# Patient Record
Sex: Female | Born: 2000 | Race: Black or African American | Hispanic: No | Marital: Single | Smoking: Never smoker
Health system: Southern US, Community
[De-identification: ages and names within clinical notes are randomized; demographics above are authoritative.]

---

## 2021-04-21 DIAGNOSIS — N3001 Acute cystitis with hematuria: Secondary | ICD-10-CM | POA: Diagnosis not present

## 2021-04-21 DIAGNOSIS — R109 Unspecified abdominal pain: Secondary | ICD-10-CM | POA: Diagnosis not present

## 2021-04-21 DIAGNOSIS — R319 Hematuria, unspecified: Secondary | ICD-10-CM | POA: Diagnosis not present

## 2021-05-19 DIAGNOSIS — J988 Other specified respiratory disorders: Secondary | ICD-10-CM | POA: Diagnosis not present

## 2021-06-17 DIAGNOSIS — N390 Urinary tract infection, site not specified: Secondary | ICD-10-CM | POA: Diagnosis not present

## 2021-09-07 DIAGNOSIS — N3001 Acute cystitis with hematuria: Secondary | ICD-10-CM | POA: Diagnosis not present

## 2021-09-07 DIAGNOSIS — R350 Frequency of micturition: Secondary | ICD-10-CM | POA: Diagnosis not present

## 2021-09-07 DIAGNOSIS — R300 Dysuria: Secondary | ICD-10-CM | POA: Diagnosis not present

## 2021-09-07 DIAGNOSIS — M545 Low back pain, unspecified: Secondary | ICD-10-CM | POA: Diagnosis not present

## 2021-12-14 ENCOUNTER — Emergency Department (HOSPITAL_COMMUNITY): Payer: 59

## 2021-12-14 ENCOUNTER — Emergency Department (HOSPITAL_COMMUNITY)
Admission: EM | Admit: 2021-12-14 | Discharge: 2021-12-14 | Disposition: A | Discharge status: Home / Self Care | Payer: 59 | Attending: Emergency Medicine | Admitting: Emergency Medicine

## 2021-12-14 ENCOUNTER — Encounter (HOSPITAL_COMMUNITY): Payer: Self-pay

## 2021-12-14 ENCOUNTER — Other Ambulatory Visit: Payer: Self-pay

## 2021-12-14 DIAGNOSIS — O0001 Abdominal pregnancy with intrauterine pregnancy: Secondary | ICD-10-CM | POA: Insufficient documentation

## 2021-12-14 DIAGNOSIS — R350 Frequency of micturition: Secondary | ICD-10-CM | POA: Diagnosis not present

## 2021-12-14 DIAGNOSIS — R1030 Lower abdominal pain, unspecified: Secondary | ICD-10-CM

## 2021-12-14 DIAGNOSIS — Z3490 Encounter for supervision of normal pregnancy, unspecified, unspecified trimester: Secondary | ICD-10-CM

## 2021-12-14 DIAGNOSIS — R42 Dizziness and giddiness: Secondary | ICD-10-CM | POA: Diagnosis not present

## 2021-12-14 DIAGNOSIS — R102 Pelvic and perineal pain: Secondary | ICD-10-CM | POA: Diagnosis not present

## 2021-12-14 DIAGNOSIS — R110 Nausea: Secondary | ICD-10-CM | POA: Diagnosis not present

## 2021-12-14 DIAGNOSIS — Z3A01 Less than 8 weeks gestation of pregnancy: Secondary | ICD-10-CM | POA: Insufficient documentation

## 2021-12-14 DIAGNOSIS — O0991 Supervision of high risk pregnancy, unspecified, first trimester: Secondary | ICD-10-CM | POA: Diagnosis not present

## 2021-12-14 DIAGNOSIS — R55 Syncope and collapse: Secondary | ICD-10-CM

## 2021-12-14 LAB — WET PREP, GENITAL
Clue Cells Wet Prep HPF POC: NONE SEEN
Sperm: NONE SEEN
Trich, Wet Prep: NONE SEEN
WBC, Wet Prep HPF POC: <10 (ref <10)
Yeast Wet Prep HPF POC: NONE SEEN

## 2021-12-14 LAB — CBC
HCT: 38.1 % (ref 36.0–46.0)
Hemoglobin: 12.6 g/dL (ref 12.0–15.0)
MCH: 26.6 pg (ref 26.0–34.0)
MCHC: 33.1 g/dL (ref 30.0–36.0)
MCV: 80.5 fL (ref 80.0–100.0)
Platelets: 338 10*3/uL (ref 150–400)
RBC: 4.73 MIL/uL (ref 3.87–5.11)
RDW: 14.8 % (ref 11.5–15.5)
WBC: 5.5 10*3/uL (ref 4.0–10.5)
nRBC: 0 % (ref 0.0–0.2)

## 2021-12-14 LAB — D-DIMER, QUANTITATIVE: D-Dimer, Quant: <0.27 ug/mL-FEU (ref 0.00–0.50)

## 2021-12-14 LAB — URINALYSIS, ROUTINE W REFLEX MICROSCOPIC
Bilirubin Urine: NEGATIVE
Glucose, UA: NEGATIVE mg/dL
Hgb urine dipstick: NEGATIVE
Ketones, ur: NEGATIVE mg/dL
Leukocytes,Ua: NEGATIVE
Nitrite: NEGATIVE
Protein, ur: NEGATIVE mg/dL
Specific Gravity, Urine: 1.01 (ref 1.005–1.030)
pH: 6 (ref 5.0–8.0)

## 2021-12-14 LAB — BASIC METABOLIC PANEL
Anion gap: 7 (ref 5–15)
BUN: 9 mg/dL (ref 6–20)
CO2: 21 mmol/L — ABNORMAL LOW (ref 22–32)
Calcium: 9.1 mg/dL (ref 8.9–10.3)
Chloride: 104 mmol/L (ref 98–111)
Creatinine, Ser: 0.54 mg/dL (ref 0.44–1.00)
GFR, Estimated: >60 mL/min (ref >60)
Glucose, Bld: 85 mg/dL (ref 70–99)
Potassium: 3.6 mmol/L (ref 3.5–5.1)
Sodium: 132 mmol/L — ABNORMAL LOW (ref 135–145)

## 2021-12-14 LAB — HCG, QUANTITATIVE, PREGNANCY: hCG, Beta Chain, Quant, S: 71136 m[IU]/mL — ABNORMAL HIGH (ref <5)

## 2021-12-14 MED ORDER — SODIUM CHLORIDE 0.9 % IV BOLUS
1000.0000 mL | Freq: Once | INTRAVENOUS | Status: AC
  Administered 2021-12-14: 1000 mL via INTRAVENOUS

## 2021-12-14 MED ORDER — ONDANSETRON 4 MG PO TBDP: 4.0000 mg | ORAL_TABLET | Freq: Three times a day (TID) | ORAL | 0 refills | Status: AC | PRN

## 2021-12-14 MED ORDER — ACETAMINOPHEN 500 MG PO TABS
1000.0000 mg | ORAL_TABLET | Freq: Once | ORAL | Status: AC
  Administered 2021-12-14: 1000 mg via ORAL
  Filled 2021-12-14: qty 2

## 2021-12-14 MED ORDER — ONDANSETRON HCL 4 MG/2ML IJ SOLN
4.0000 mg | Freq: Once | INTRAMUSCULAR | Status: AC
  Administered 2021-12-14: 4 mg via INTRAVENOUS
  Filled 2021-12-14: qty 2

## 2021-12-14 NOTE — ED Provider Notes (Addendum)
?[facility]-EMERGENCY DEPT ?Provider Note ? ? ?CSN: [id number] ?Arrival date & time: 12/14/21  8315 ? ?  ? ?History ? ?Chief Complaint  ?Patient presents with  ? Loss of Consciousness  ? Abdominal Pain  ? ? ?Chenika Nevils is a 21 y.o. female. ? ? ?Loss of Consciousness ?Abdominal Pain ? ?  ?21 year old female G1P0 [redacted] weeks pregnant by LMP who presents to the ED with abdominal pain and syncope. The patient states that she woke up this morning, felt lightheaded, nauseated, was walking back from the bathroom when she passed out. No head trauma. Syncopal event was witnessed. She has had increased urinary frequency but denies dysuria. She denies vaginal bleeding, no increased vaginal discharge. She is not worried about STIs. She has had no other episodes of syncope while pregnant and denies any hx of syncope in general. She endorses 2-3 weeks of bilateral lower abdominal cramping. She has not yet established care with an OBGYN. She has not had a pelvic ultrasound. Her LMP was Feb 7th.  ?Home Medications ?Prior to Admission medications   ?Medication Sig Start Date End Date Taking? Authorizing Provider  ?ondansetron (ZOFRAN-ODT) 4 MG disintegrating tablet Take 1 tablet (4 mg total) by mouth every 8 (eight) hours as needed for nausea or vomiting. 12/14/21  Yes Ernie Avena, MD  ?   ? ?Allergies    ?Patient has no known allergies.   ? ?Review of Systems   ?Review of Systems  ?Cardiovascular:  Positive for syncope.  ?Gastrointestinal:  Positive for abdominal pain.  ? ?Physical Exam ?Updated Vital Signs ?BP 103/86   Pulse (!) 58   Temp 98.2 ?F (36.8 ?C) (Oral)   Resp 18   Ht 5\' 8"  (1.727 m)   Wt 56.7 kg   LMP 10/27/2021   SpO2 94%   BMI 19.01 kg/m?  ?Physical Exam ?Vitals and nursing note reviewed.  ?Constitutional:   ?   General: She is not in acute distress. ?   Appearance: She is well-developed.  ?HENT:  ?   Head: Normocephalic and atraumatic.  ?   Mouth/Throat:  ?   Mouth: Mucous membranes are dry.   ?Eyes:  ?   Conjunctiva/sclera: Conjunctivae normal.  ?Cardiovascular:  ?   Rate and Rhythm: Normal rate and regular rhythm.  ?Pulmonary:  ?   Effort: Pulmonary effort is normal. No respiratory distress.  ?   Breath sounds: Normal breath sounds.  ?Abdominal:  ?   Palpations: Abdomen is soft.  ?   Tenderness: There is abdominal tenderness in the right lower quadrant, suprapubic area and left lower quadrant. There is no guarding or rebound.  ?Musculoskeletal:     ?   General: No swelling.  ?   Cervical back: Neck supple.  ?Skin: ?   General: Skin is warm and dry.  ?   Capillary Refill: Capillary refill takes less than 2 seconds.  ?Neurological:  ?   Mental Status: She is alert.  ?Psychiatric:     ?   Mood and Affect: Mood normal.  ? ? ?ED Results / Procedures / Treatments   ?Labs ?(all labs ordered are listed, but only abnormal results are displayed) ?Labs Reviewed  ?BASIC METABOLIC PANEL - Abnormal; Notable for the following components:  ?    Result Value  ? Sodium 132 (*)   ? CO2 21 (*)   ? All other components within normal limits  ?HCG, QUANTITATIVE, PREGNANCY - Abnormal; Notable for the following components:  ? hCG, Beta  Chain, Quant, S 71,136 (*)   ? All other components within normal limits  ?WET PREP, GENITAL  ?URINE CULTURE  ?CBC  ?URINALYSIS, ROUTINE W REFLEX MICROSCOPIC  ?D-DIMER, QUANTITATIVE  ?CBG MONITORING, ED  ?GC/CHLAMYDIA PROBE AMP ([place]) NOT AT [facility]  ? ? ?EKG ?EKG Interpretation ? ?Date/Time:  Monday December 14 2021 11:55:54 EDT ?Ventricular Rate:  56 ?PR Interval:  154 ?QRS Duration: 94 ?QT Interval:  462 ?QTC Calculation: 446 ?R Axis:   82 ?Text Interpretation: Sinus bradycardia Confirmed by Ernie Avena ([id number]) on 12/14/2021 12:00:04 PM ? ?Radiology ?US OB Comp Less 14 Wks ? ?Result Date: 12/14/2021 ?CLINICAL DATA:  syncope, abdominal pain EXAM: OBSTETRIC <14 WK [place] AND TRANSVAGINAL OB US TECHNIQUE: Both transabdominal and transvaginal ultrasound examinations were performed for complete  evaluation of the gestation as well as the maternal uterus, adnexal regions, and pelvic cul-de-sac. Transvaginal technique was performed to assess early pregnancy. COMPARISON:  None. FINDINGS: Intrauterine gestational sac: Single Yolk sac:  Visualized. Embryo:  Visualized. Cardiac Activity: Visualized. Heart Rate: 136 bpm CRL:  9.9 mm   7 w   0 d                  [place] EDD: 08/03/2022 Subchorionic hemorrhage:  None visualized. Maternal uterus/adnexae: Normal right ovary. The left ovary is not visualized due to bowel gas. IMPRESSION: Single viable intrauterine pregnancy. Estimated gestational age [redacted] weeks 0 days. Electronically Signed   By: Caprice Renshaw M.D.   On: 12/14/2021 11:02  ? ?US PELVIC DOPPLER (TORSION R/O OR MASS ARTERIAL FLOW) ? ?Result Date: 12/14/2021 ?CLINICAL DATA:  Pelvic pain EXAM: DOPPLER ULTRASOUND OF OVARIES TECHNIQUE: Color and duplex Doppler ultrasound was utilized to evaluate blood flow to the ovaries. COMPARISON:  Earlier today FINDINGS: Right ovary: Measures 3.1 x 2.1 x 2.1 cm. (Volume = 7.2 cm^3). Appears normal. No mass. Left ovary: Measures 2.9 x 1.8 x 2.0 cm (volume = 2 cm^3). Appears normal. No mass. Pulsed Doppler evaluation of both ovaries demonstrates normal low-resistance arterial and venous waveforms. IMPRESSION: 1. No findings to explain patient's pelvic pain. 2. No evidence for ovarian torsion. Electronically Signed   By: Signa Kell M.D.   On: 12/14/2021 14:15   ? ?Procedures ?Procedures  ? ? ?Medications Ordered in ED ?Medications  ?sodium chloride 0.9 % bolus 1,000 mL (0 mLs Intravenous Stopped 12/14/21 1221)  ?acetaminophen (TYLENOL) tablet 1,000 mg (1,000 mg Oral Given 12/14/21 1228)  ?ondansetron [facility]) injection 4 mg (4 mg Intravenous Given 12/14/21 1228)  ? ? ?ED Course/ Medical Decision Making/ A&P ?  ?                        ?Medical Decision Making ?Amount and/or Complexity of Data Reviewed ?Labs: ordered. ?Radiology: ordered. ? ?Risk ?OTC drugs. ?Prescription drug  management. ? ? ? ?21 year old female G1P0 [redacted] weeks pregnant by LMP who presents to the ED with abdominal pain and syncope. The patient states that she woke up this morning, felt lightheaded, nauseated, was walking back from the bathroom when she passed out. No head trauma. Syncopal event was witnessed. She has had increased urinary frequency but denies dysuria. She denies vaginal bleeding, no increased vaginal discharge. She is not worried about STIs. She has had no other episodes of syncope while pregnant and denies any hx of syncope in general. She endorses 2-3 weeks of bilateral lower abdominal cramping. She has not yet established care with an OBGYN. She has not  had a pelvic ultrasound. Her LMP was Feb 7th.  ? ?On arrival, the patient was vitally stable. NSR on cardiac telemetry.  EKGs were performed and revealed mild sinus bradycardia with a HR of 56. No other abnormal intervals. No QRS widening or delta wave. ? ?Physical exam significant for lungs that were clear to auscultation bilaterally, no significant tachycardia, initial vitals on arrival significant for temperature 98.2, hemodynamically stable, pulse 67, BP 109/79, not tachypneic or tachycardic, saturating well on room air.  On exam, the patient had mild left lower quadrant tenderness to palpation, suprapubic tenderness and right lower quadrant tenderness. ? ?Differential diagnosis includes ectopic pregnancy, vasovagal syncope, less likely PE, tubo-ovarian abscess, PID, STI, UTI, nephrolithiasis. ? ?Laboratory work-up significant for a BMP that was generally unremarkable, mild hyponatremia to 132, CBC without a leukocytosis or anemia, hCG appropriately elevated at 71,000, urinalysis without hematuria or evidence of UTI.  A wet prep was performed which was negative for yeast, trichomonas, clue cells and had a normal white blood cell count.  GC/chlamydia testing was collected and pending although the patient declines STI prophylaxis at this time. After  discussion with the patient, pelvic exam deferred and patient self swabbed. ? ?OB US performed and revealed the following: ?Single viable intrauterine pregnancy. Estimated gestational age 20  ?weeks 0 days.  ? ?Because the p

## 2021-12-14 NOTE — Discharge Instructions (Addendum)
Your workup today was reassuring. Your ultrasounds revealed normal ovaries and an intrauterine pregnancy at [redacted] weeks gestational age. Take Tylenol for any discomfort and Zofran ODT has been prescribed for nausea. ?

## 2021-12-14 NOTE — ED Triage Notes (Signed)
Patient c/o syncope this AM. Patient states she did not hit her head. Patient also reports that this has occurred several times in the past few weeks. ?Patient states she is [redacted] weeks pregnant. ? ?Patient c/o lower abdominal pain and nausea that began 2 weeks ago. Patient denies any vaginal bleeding or discharge. ?

## 2021-12-15 LAB — GC/CHLAMYDIA PROBE AMP (~~LOC~~) NOT AT ARMC
Chlamydia: NEGATIVE
Comment: NEGATIVE
Comment: NORMAL
Neisseria Gonorrhea: NEGATIVE

## 2021-12-15 LAB — URINE CULTURE

## 2022-04-20 DIAGNOSIS — S92354A Nondisplaced fracture of fifth metatarsal bone, right foot, initial encounter for closed fracture: Secondary | ICD-10-CM | POA: Diagnosis not present

## 2022-04-20 DIAGNOSIS — X501XXA Overexertion from prolonged static or awkward postures, initial encounter: Secondary | ICD-10-CM | POA: Diagnosis not present

## 2022-09-14 IMAGING — US US ART/VEN ABD/PELV/SCROTUM DOPPLER LTD
1 series · 15 of 17 positions shown · non-contrast
Comparison: Earlier today

CLINICAL DATA: Pelvic pain

EXAM:
DOPPLER ULTRASOUND OF OVARIES
TECHNIQUE: Color and duplex Doppler ultrasound was utilized to evaluate blood
flow to the ovaries.

[Series 1: us art/ven flow abd pelv doppl mc & wl · 15 of 17 slices shown]
[im 1/17]
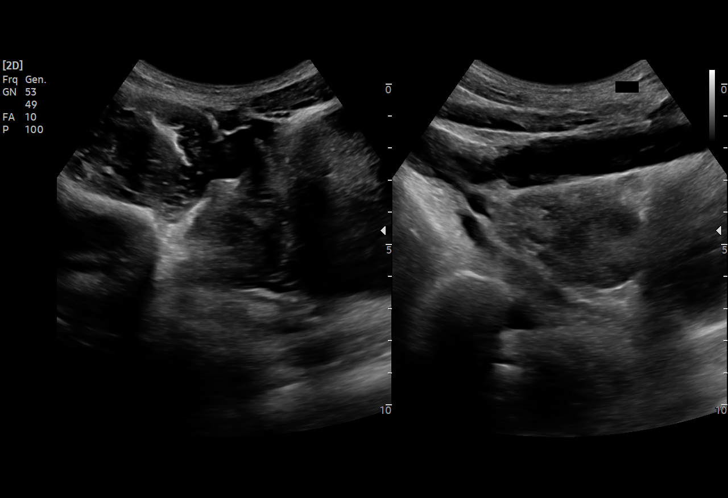
[im 2/17]
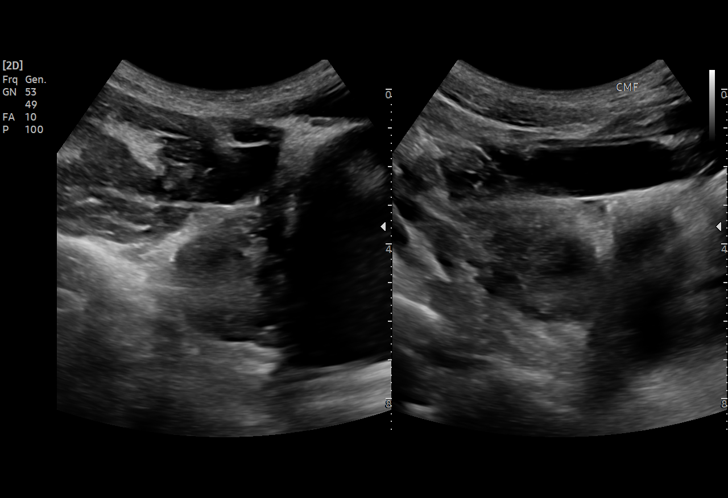
[im 3/17]
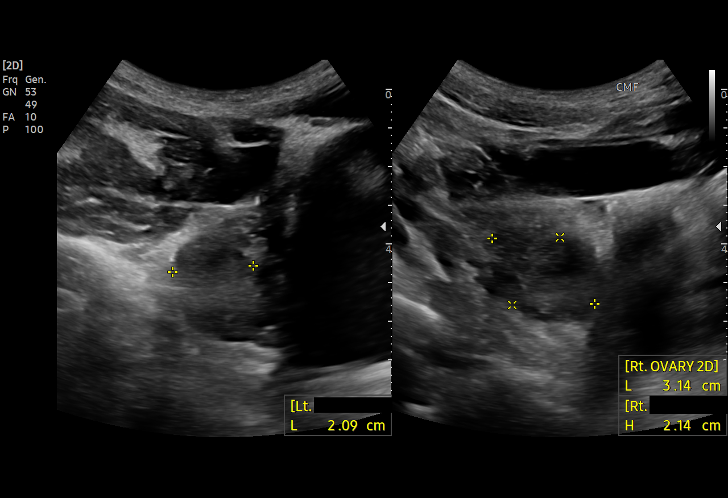
[im 4/17]
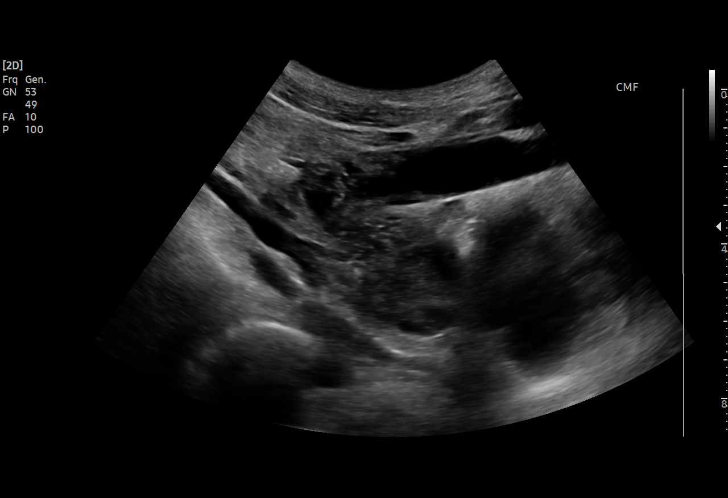
[im 6/17]
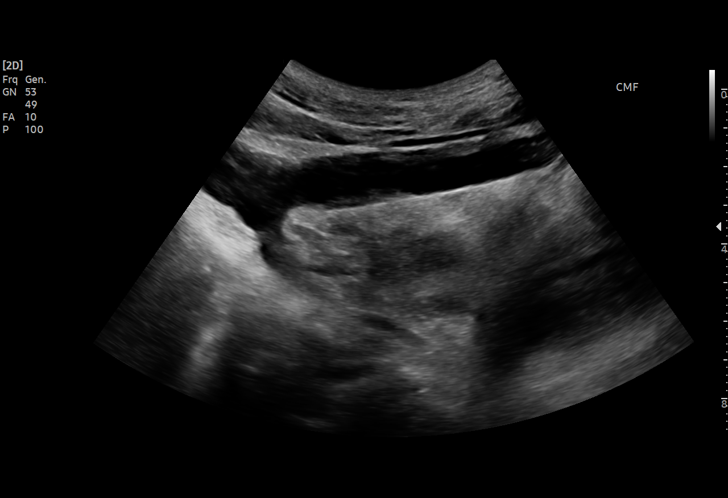
[im 7/17]
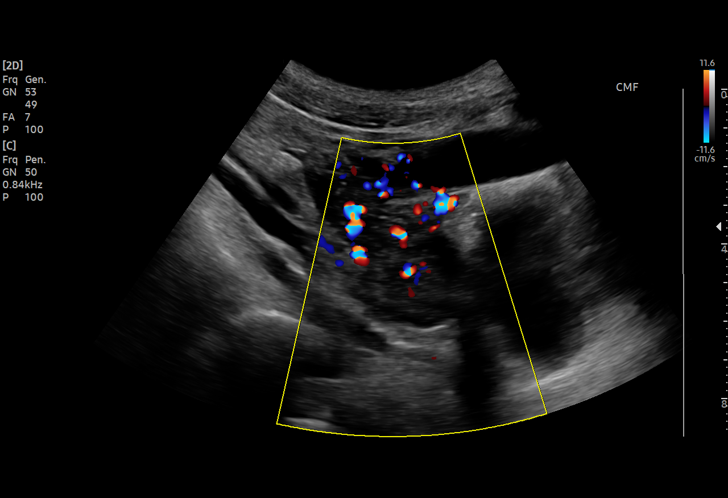
[im 8/17]
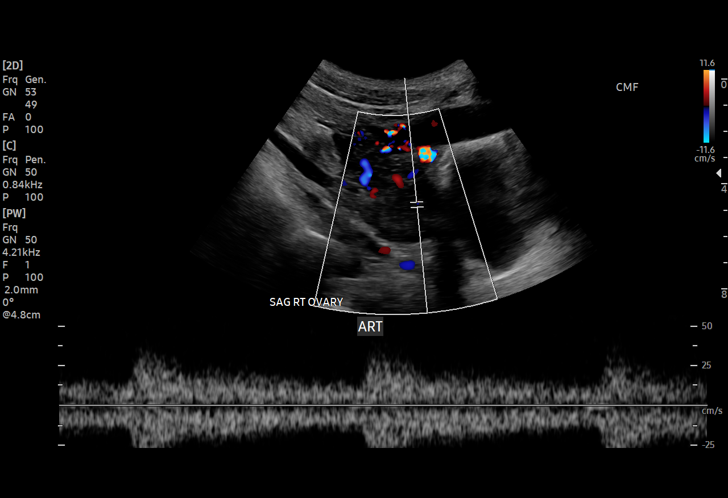
[im 9/17]
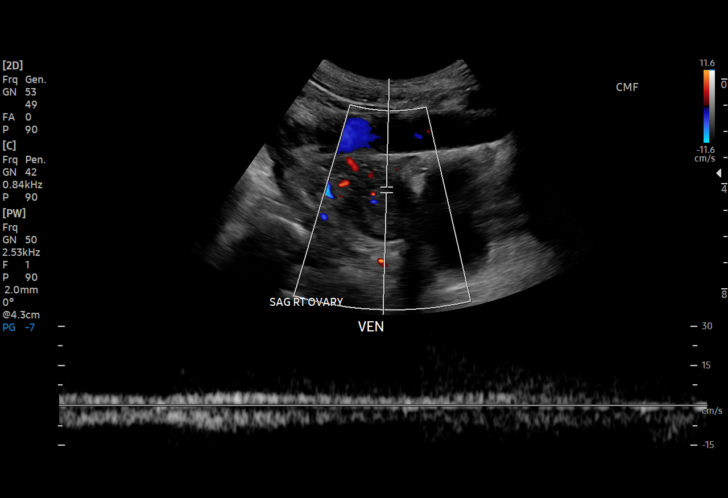
[im 10/17]
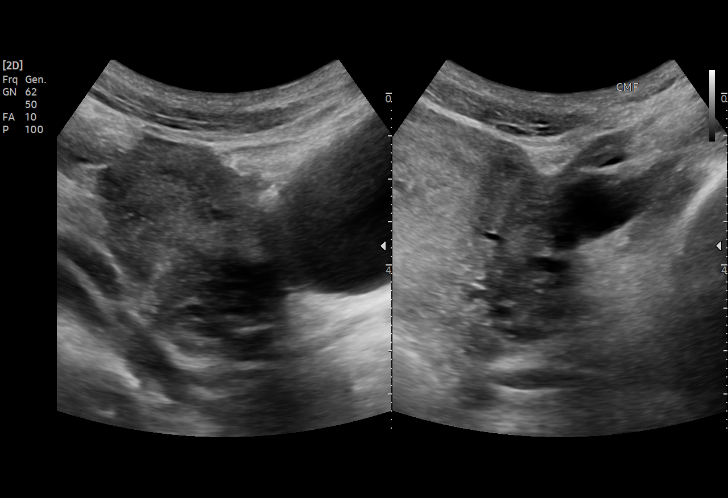
[im 11/17]
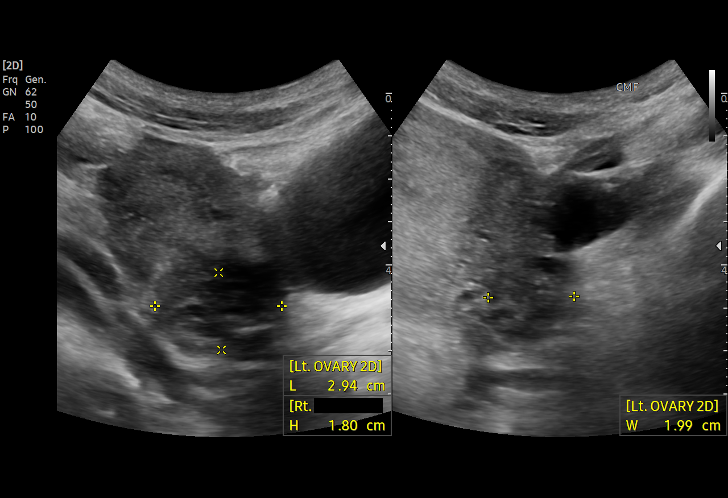
[im 12/17]
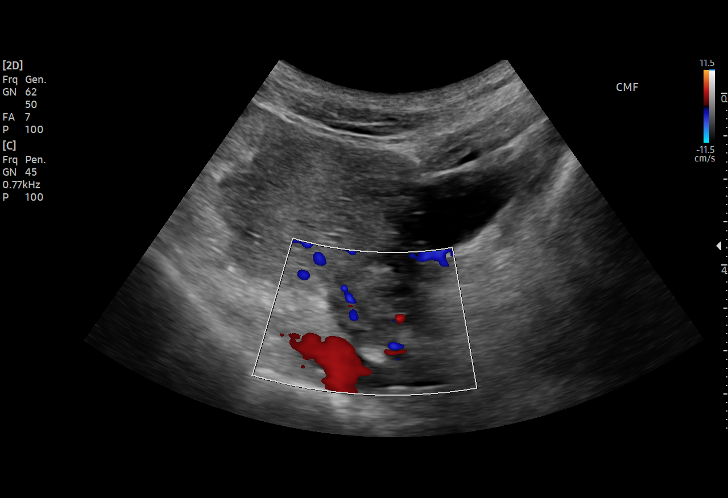
[im 14/17]
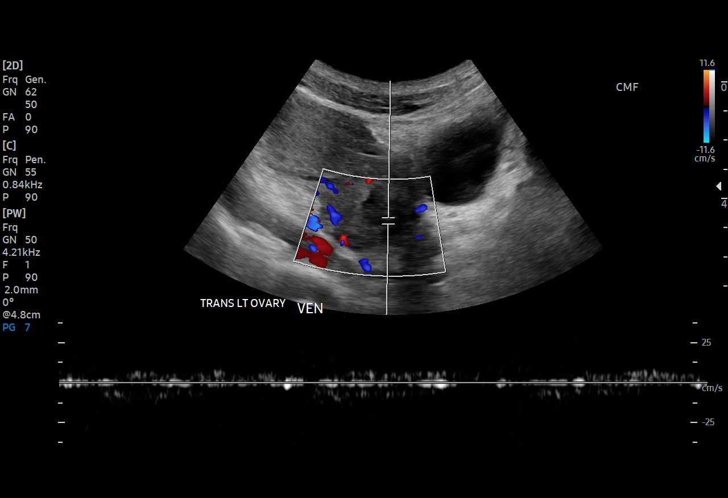
[im 15/17]
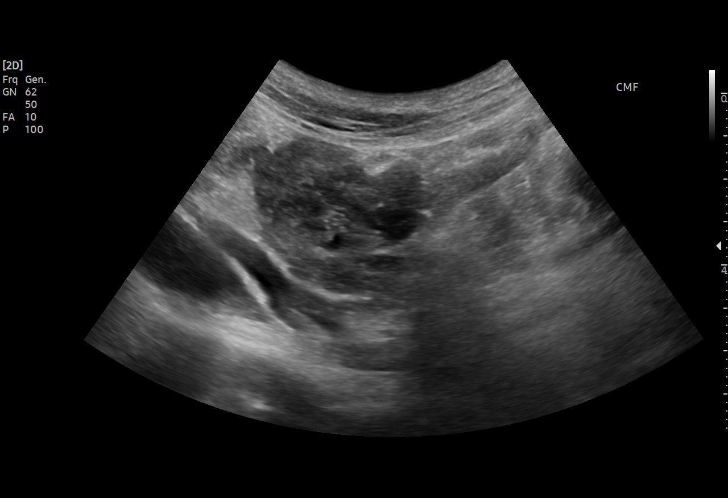
[im 16/17]
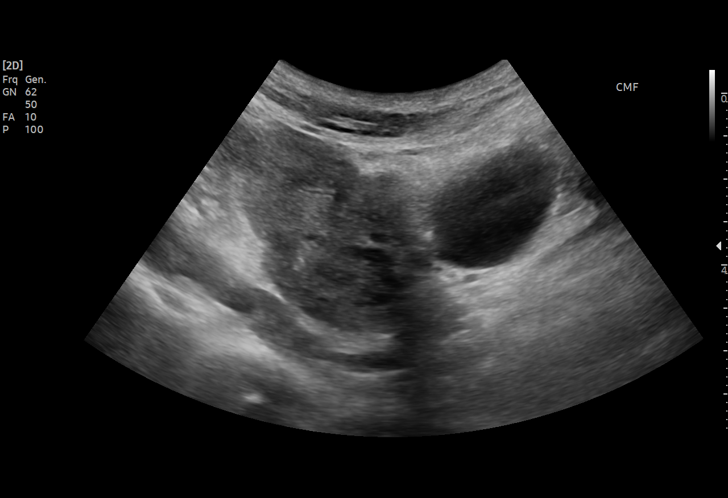
[im 17/17]
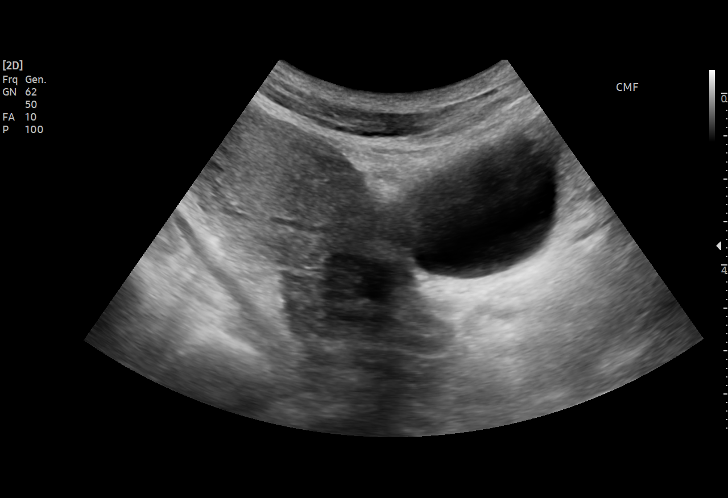

[15 of 17 positions shown; findings below may reference images not displayed]

FINDINGS: Right ovary: Measures 3.1 x 2.1 x 2.1 cm. (Volume = 7.2 cm^3).
Appears normal. No mass.

Left ovary: Measures 2.9 x 1.8 x 2.0 cm (volume = 2 cm^3). Appears
normal. No mass.

Pulsed Doppler evaluation of both ovaries demonstrates normal
low-resistance arterial and venous waveforms.
IMPRESSION: 1. No findings to explain patient's pelvic pain.
2. No evidence for ovarian torsion.

## 2022-09-14 IMAGING — US US OB COMP LESS 14 WK
1 series · 14 of 28 positions shown · non-contrast
Comparison: None.

CLINICAL DATA: syncope, abdominal pain

EXAM:
OBSTETRIC <14 WK US AND TRANSVAGINAL OB US
TECHNIQUE: Both transabdominal and transvaginal ultrasound examinations were
performed for complete evaluation of the gestation as well as the
maternal uterus, adnexal regions, and pelvic cul-de-sac.
Transvaginal technique was performed to assess early pregnancy.

[Series 1: us ob comp less 14 wk · 14 of 72 slices shown]
[im 3/72]
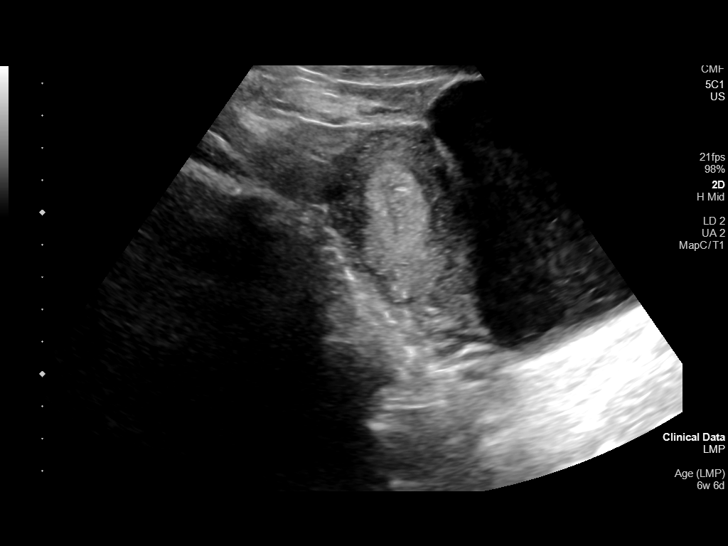
[im 8/72]
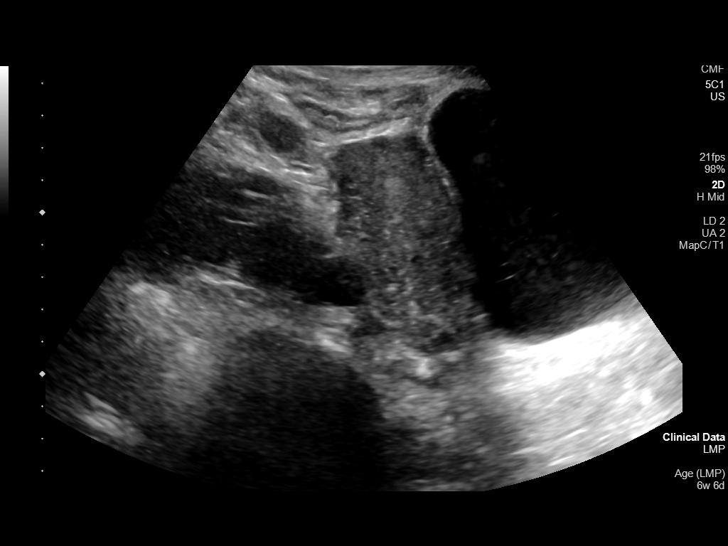
[im 14/72]
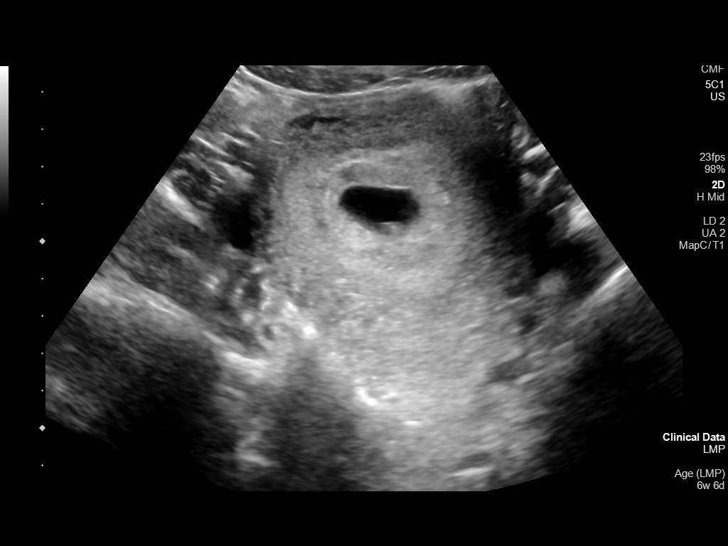
[im 19/72]
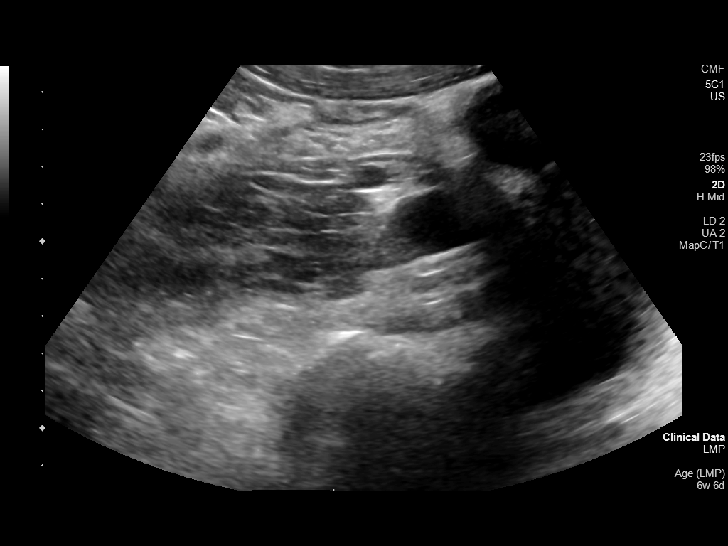
[im 24/72]
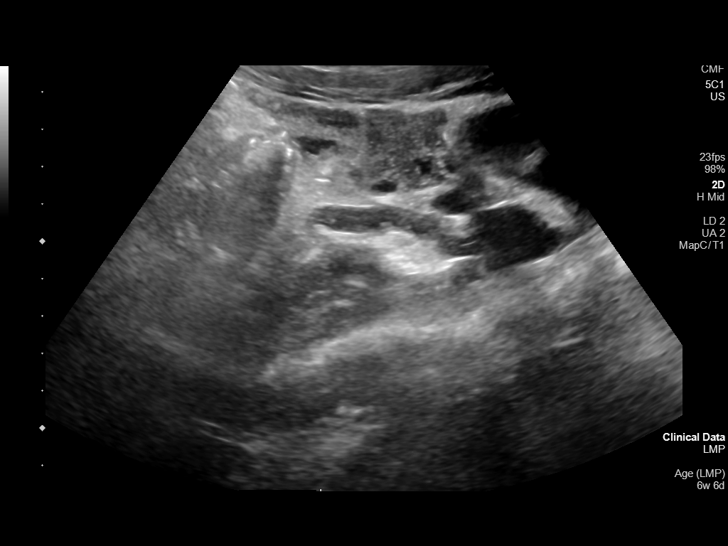
[im 29/72]
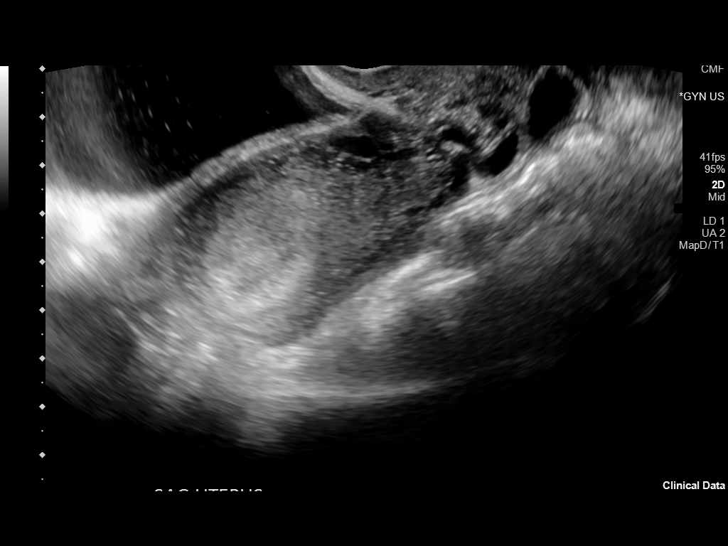
[im 35/72]
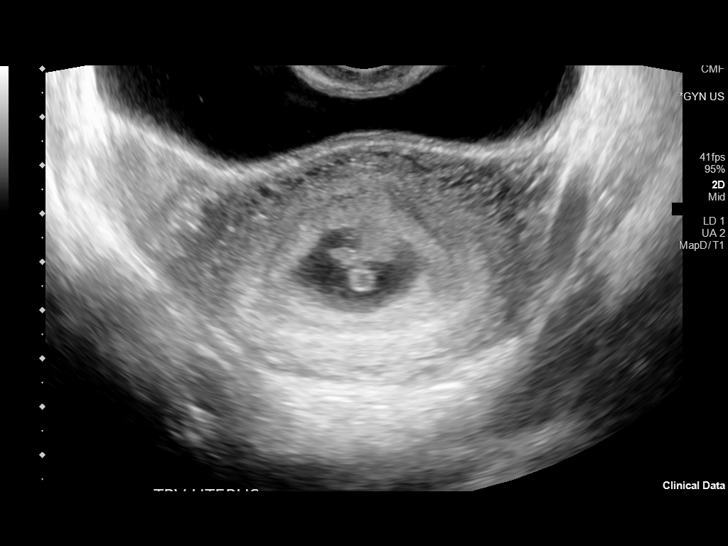
[im 40/72]
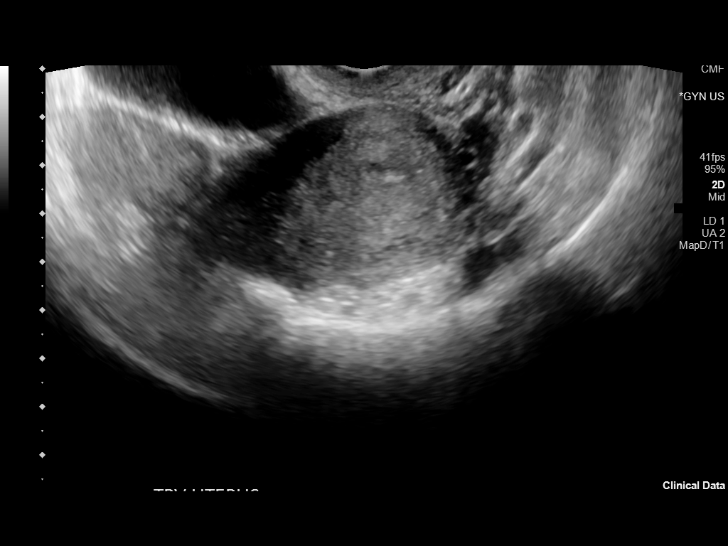
[im 45/72]
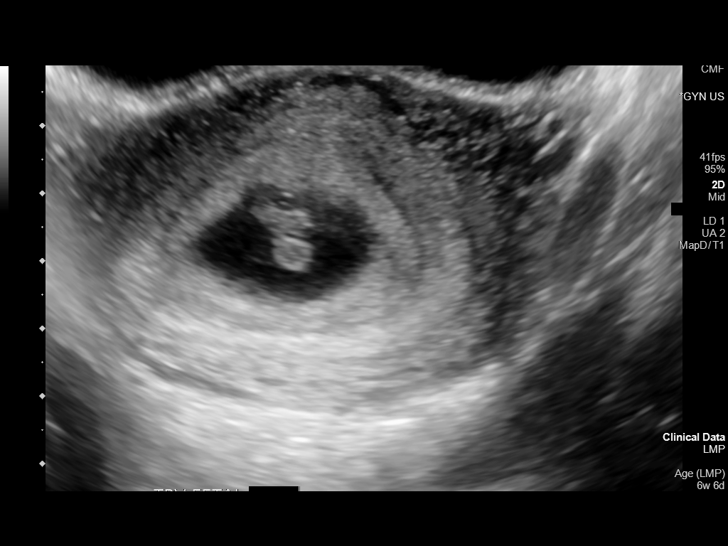
[im 50/72]
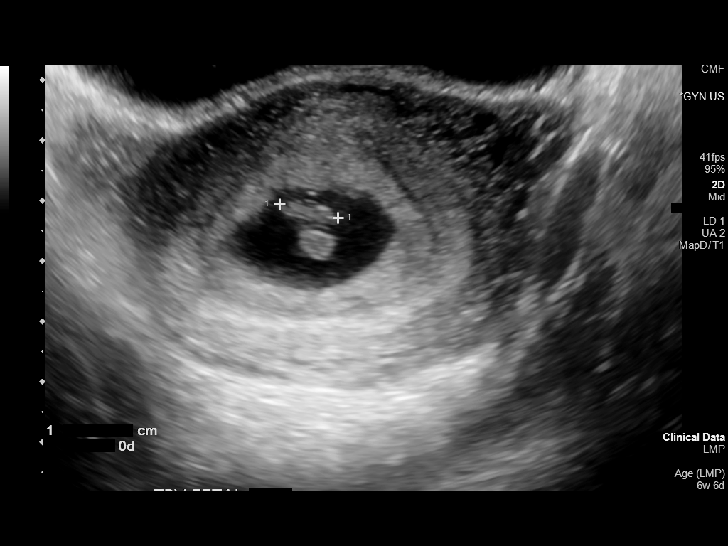
[im 56/72]
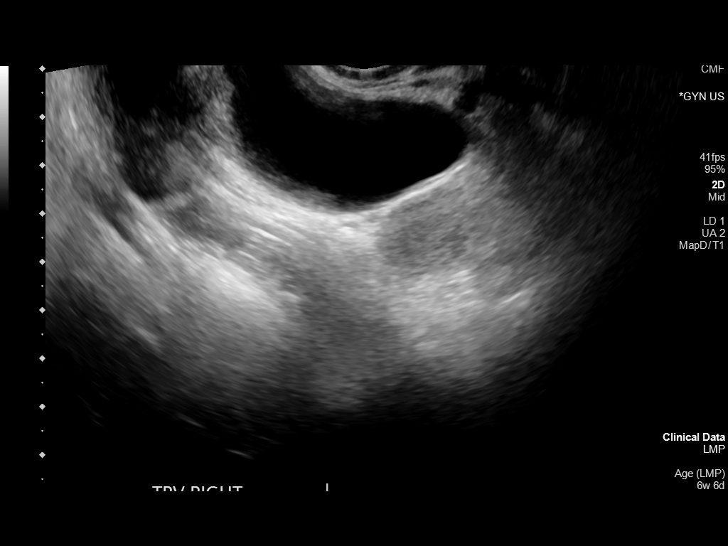
[im 61/72]
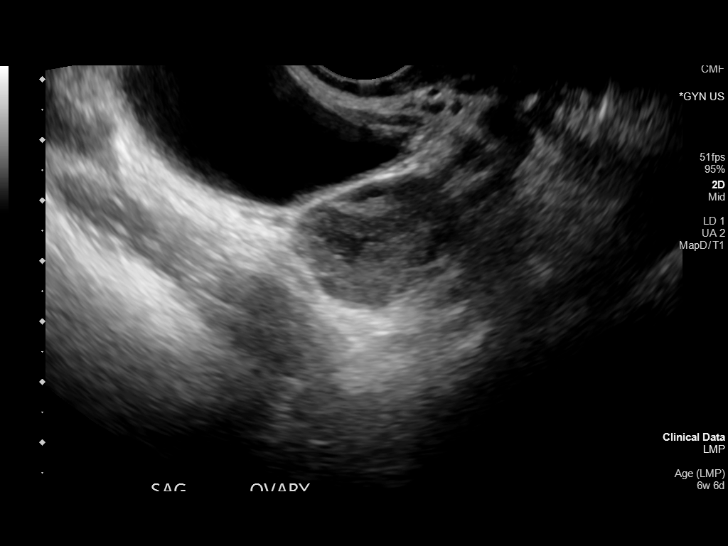
[im 66/72]
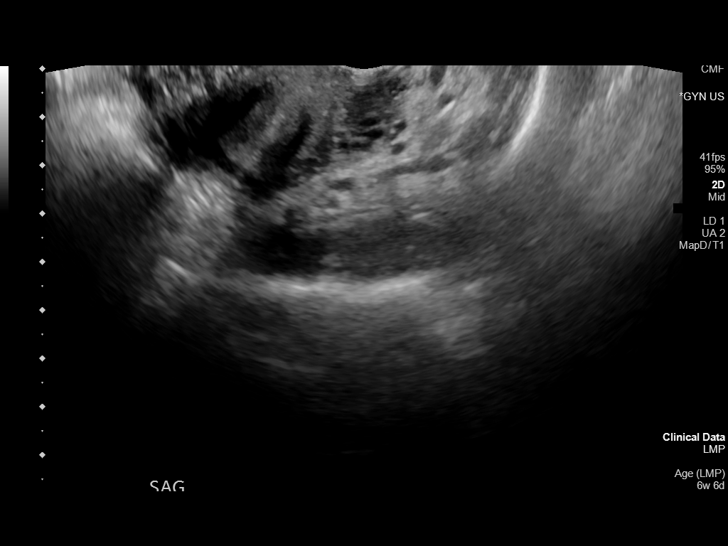
[im 72/72]
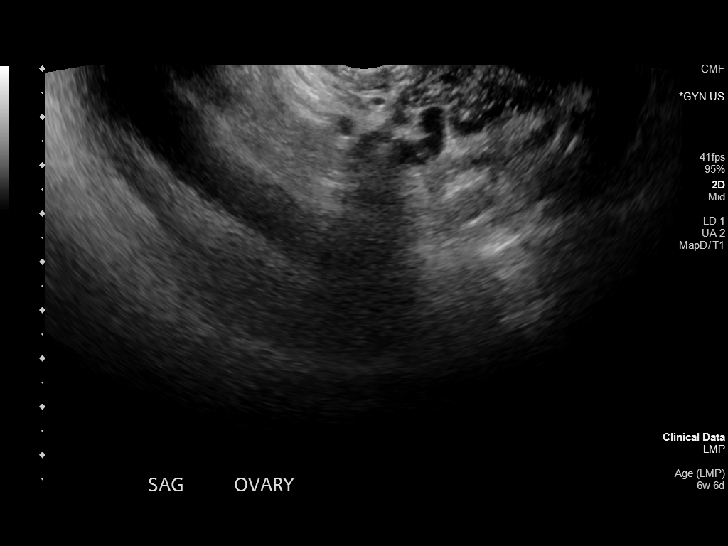

[14 of 28 positions shown; findings below may reference images not displayed]

FINDINGS: Intrauterine gestational sac: Single

Yolk sac:  Visualized.

Embryo:  Visualized.

Cardiac Activity: Visualized.

Heart Rate: 136 bpm

CRL:  9.9 mm   7 w   0 d                  US EDD: 08/03/2022

Subchorionic hemorrhage:  None visualized.

Maternal uterus/adnexae: Normal right ovary. The left ovary is not
visualized due to bowel gas.
IMPRESSION: Single viable intrauterine pregnancy. Estimated gestational age 7
weeks 0 days.

## 2022-10-22 DIAGNOSIS — F439 Reaction to severe stress, unspecified: Secondary | ICD-10-CM | POA: Diagnosis not present

## 2022-10-26 DIAGNOSIS — M25571 Pain in right ankle and joints of right foot: Secondary | ICD-10-CM | POA: Diagnosis not present

## 2022-12-16 DIAGNOSIS — F3181 Bipolar II disorder: Secondary | ICD-10-CM | POA: Diagnosis not present

## 2022-12-16 DIAGNOSIS — F411 Generalized anxiety disorder: Secondary | ICD-10-CM | POA: Diagnosis not present

## 2022-12-16 DIAGNOSIS — F410 Panic disorder [episodic paroxysmal anxiety] without agoraphobia: Secondary | ICD-10-CM | POA: Diagnosis not present

## 2023-01-06 DIAGNOSIS — F439 Reaction to severe stress, unspecified: Secondary | ICD-10-CM | POA: Diagnosis not present

## 2023-03-18 DIAGNOSIS — M9902 Segmental and somatic dysfunction of thoracic region: Secondary | ICD-10-CM | POA: Diagnosis not present

## 2023-03-18 DIAGNOSIS — M9906 Segmental and somatic dysfunction of lower extremity: Secondary | ICD-10-CM | POA: Diagnosis not present

## 2023-03-18 DIAGNOSIS — M5387 Other specified dorsopathies, lumbosacral region: Secondary | ICD-10-CM | POA: Diagnosis not present

## 2023-03-18 DIAGNOSIS — M9901 Segmental and somatic dysfunction of cervical region: Secondary | ICD-10-CM | POA: Diagnosis not present

## 2023-03-18 DIAGNOSIS — M5382 Other specified dorsopathies, cervical region: Secondary | ICD-10-CM | POA: Diagnosis not present

## 2023-03-22 DIAGNOSIS — F3181 Bipolar II disorder: Secondary | ICD-10-CM | POA: Diagnosis not present

## 2023-03-22 DIAGNOSIS — F4312 Post-traumatic stress disorder, chronic: Secondary | ICD-10-CM | POA: Diagnosis not present

## 2023-03-22 DIAGNOSIS — F411 Generalized anxiety disorder: Secondary | ICD-10-CM | POA: Diagnosis not present

## 2023-03-22 DIAGNOSIS — F410 Panic disorder [episodic paroxysmal anxiety] without agoraphobia: Secondary | ICD-10-CM | POA: Diagnosis not present

## 2023-05-24 DIAGNOSIS — Z113 Encounter for screening for infections with a predominantly sexual mode of transmission: Secondary | ICD-10-CM | POA: Diagnosis not present

## 2023-05-26 DIAGNOSIS — F439 Reaction to severe stress, unspecified: Secondary | ICD-10-CM | POA: Diagnosis not present

## 2023-06-15 DIAGNOSIS — F411 Generalized anxiety disorder: Secondary | ICD-10-CM | POA: Diagnosis not present

## 2023-06-15 DIAGNOSIS — F4312 Post-traumatic stress disorder, chronic: Secondary | ICD-10-CM | POA: Diagnosis not present

## 2023-06-15 DIAGNOSIS — F3181 Bipolar II disorder: Secondary | ICD-10-CM | POA: Diagnosis not present

## 2023-06-15 DIAGNOSIS — F410 Panic disorder [episodic paroxysmal anxiety] without agoraphobia: Secondary | ICD-10-CM | POA: Diagnosis not present

## 2023-07-22 DIAGNOSIS — Z23 Encounter for immunization: Secondary | ICD-10-CM | POA: Diagnosis not present

## 2023-07-22 DIAGNOSIS — S6722XA Crushing injury of left hand, initial encounter: Secondary | ICD-10-CM | POA: Diagnosis not present

## 2023-08-01 DIAGNOSIS — F3181 Bipolar II disorder: Secondary | ICD-10-CM | POA: Diagnosis not present

## 2023-08-01 DIAGNOSIS — F411 Generalized anxiety disorder: Secondary | ICD-10-CM | POA: Diagnosis not present

## 2023-08-22 DIAGNOSIS — M79645 Pain in left finger(s): Secondary | ICD-10-CM | POA: Diagnosis not present

## 2023-08-29 DIAGNOSIS — Z113 Encounter for screening for infections with a predominantly sexual mode of transmission: Secondary | ICD-10-CM | POA: Diagnosis not present

## 2023-08-30 DIAGNOSIS — M79645 Pain in left finger(s): Secondary | ICD-10-CM | POA: Diagnosis not present

## 2023-10-19 DIAGNOSIS — J029 Acute pharyngitis, unspecified: Secondary | ICD-10-CM | POA: Diagnosis not present

## 2023-11-01 DIAGNOSIS — N760 Acute vaginitis: Secondary | ICD-10-CM | POA: Diagnosis not present

## 2023-11-02 DIAGNOSIS — Z113 Encounter for screening for infections with a predominantly sexual mode of transmission: Secondary | ICD-10-CM | POA: Diagnosis not present

## 2023-11-22 DIAGNOSIS — M79671 Pain in right foot: Secondary | ICD-10-CM | POA: Diagnosis not present

## 2023-12-06 DIAGNOSIS — M79671 Pain in right foot: Secondary | ICD-10-CM | POA: Diagnosis not present

## 2023-12-27 DIAGNOSIS — M79671 Pain in right foot: Secondary | ICD-10-CM | POA: Diagnosis not present

## 2024-01-04 DIAGNOSIS — M79671 Pain in right foot: Secondary | ICD-10-CM | POA: Diagnosis not present

## 2024-01-26 DIAGNOSIS — S93621A Sprain of tarsometatarsal ligament of right foot, initial encounter: Secondary | ICD-10-CM | POA: Diagnosis not present

## 2024-02-07 DIAGNOSIS — F410 Panic disorder [episodic paroxysmal anxiety] without agoraphobia: Secondary | ICD-10-CM | POA: Diagnosis not present

## 2024-02-07 DIAGNOSIS — F411 Generalized anxiety disorder: Secondary | ICD-10-CM | POA: Diagnosis not present

## 2024-02-07 DIAGNOSIS — F4312 Post-traumatic stress disorder, chronic: Secondary | ICD-10-CM | POA: Diagnosis not present

## 2024-02-07 DIAGNOSIS — F3181 Bipolar II disorder: Secondary | ICD-10-CM | POA: Diagnosis not present

## 2024-02-08 DIAGNOSIS — S93601D Unspecified sprain of right foot, subsequent encounter: Secondary | ICD-10-CM | POA: Diagnosis not present

## 2024-02-16 DIAGNOSIS — S93621A Sprain of tarsometatarsal ligament of right foot, initial encounter: Secondary | ICD-10-CM | POA: Diagnosis not present

## 2024-03-21 DIAGNOSIS — J029 Acute pharyngitis, unspecified: Secondary | ICD-10-CM | POA: Diagnosis not present

## 2024-06-26 DIAGNOSIS — S46011A Strain of muscle(s) and tendon(s) of the rotator cuff of right shoulder, initial encounter: Secondary | ICD-10-CM | POA: Diagnosis not present

## 2024-07-18 DIAGNOSIS — Z113 Encounter for screening for infections with a predominantly sexual mode of transmission: Secondary | ICD-10-CM | POA: Diagnosis not present

## 2024-07-19 DIAGNOSIS — Z113 Encounter for screening for infections with a predominantly sexual mode of transmission: Secondary | ICD-10-CM | POA: Diagnosis not present

## 2024-08-22 DIAGNOSIS — L732 Hidradenitis suppurativa: Secondary | ICD-10-CM | POA: Diagnosis not present
